# Patient Record
Sex: Male | Born: 1980 | Race: Asian | Hispanic: No | Marital: Married | Smoking: Never smoker
Health system: Southern US, Community
[De-identification: ages and names within clinical notes are randomized; demographics above are authoritative.]

## PROBLEM LIST (undated history)

## (undated) HISTORY — PX: NECK SURGERY: SHX720

---

## 2016-09-20 ENCOUNTER — Emergency Department (HOSPITAL_COMMUNITY): Payer: BLUE CROSS/BLUE SHIELD

## 2016-09-20 ENCOUNTER — Encounter (HOSPITAL_COMMUNITY): Payer: Self-pay

## 2016-09-20 DIAGNOSIS — Y999 Unspecified external cause status: Secondary | ICD-10-CM | POA: Diagnosis not present

## 2016-09-20 DIAGNOSIS — Y9366 Activity, soccer: Secondary | ICD-10-CM | POA: Diagnosis not present

## 2016-09-20 DIAGNOSIS — S53105A Unspecified dislocation of left ulnohumeral joint, initial encounter: Secondary | ICD-10-CM | POA: Insufficient documentation

## 2016-09-20 DIAGNOSIS — W1839XA Other fall on same level, initial encounter: Secondary | ICD-10-CM | POA: Diagnosis not present

## 2016-09-20 DIAGNOSIS — S59902A Unspecified injury of left elbow, initial encounter: Secondary | ICD-10-CM | POA: Diagnosis present

## 2016-09-20 DIAGNOSIS — Y9289 Other specified places as the place of occurrence of the external cause: Secondary | ICD-10-CM | POA: Diagnosis not present

## 2016-09-20 NOTE — ED Triage Notes (Signed)
Pt reports left elbow pain/injury secondary to playing soccer tonight. He was pushed, fell down and landed on his left elbow. Denies head injury, no LOC. + sensation, + radial pulse, able to wiggle fingers. Sling applied by nurse first.

## 2016-09-21 ENCOUNTER — Emergency Department (HOSPITAL_COMMUNITY): Payer: BLUE CROSS/BLUE SHIELD

## 2016-09-21 ENCOUNTER — Emergency Department (HOSPITAL_COMMUNITY)
Admission: EM | Admit: 2016-09-21 | Discharge: 2016-09-21 | Disposition: A | Discharge status: Home / Self Care | Payer: BLUE CROSS/BLUE SHIELD | Attending: Emergency Medicine | Admitting: Emergency Medicine

## 2016-09-21 DIAGNOSIS — S53105A Unspecified dislocation of left ulnohumeral joint, initial encounter: Secondary | ICD-10-CM

## 2016-09-21 MED ORDER — MIDAZOLAM HCL 2 MG/2ML IJ SOLN
1.0000 mg | Freq: Once | INTRAMUSCULAR | Status: AC
  Administered 2016-09-21: 1 mg via INTRAVENOUS
  Filled 2016-09-21: qty 2

## 2016-09-21 MED ORDER — HYDROMORPHONE HCL 1 MG/ML IJ SOLN
1.0000 mg | Freq: Once | INTRAMUSCULAR | Status: AC
  Administered 2016-09-21: 1 mg via INTRAVENOUS
  Filled 2016-09-21: qty 1

## 2016-09-21 MED ORDER — IBUPROFEN 800 MG PO TABS: 800.0000 mg | ORAL_TABLET | Freq: Three times a day (TID) | ORAL | 0 refills | Status: DC

## 2016-09-21 MED ORDER — HYDROCODONE-ACETAMINOPHEN 5-325 MG PO TABS: 1.0000 | ORAL_TABLET | ORAL | 0 refills | Status: DC | PRN

## 2016-09-21 NOTE — ED Provider Notes (Signed)
[facility]-EMERGENCY DEPT Provider Note   CSN: [id number] Arrival date & time: 09/20/16  2242 By signing my name below, I, Levon Hedger, attest that this documentation has been prepared under the direction and in the presence of Kyle Stevenson, Kyle Senior, MD . Electronically Signed: Levon Hedger, Scribe. 09/21/2016. 1:24 AM.   History   Chief Complaint Chief Complaint  Patient presents with  . Shoulder Pain   HPI Kyle Stevenson is a 36 y.o. male who presents to the Emergency Department complaining of sudden onset, constant pain to left lower arm extending from elbow to hand s/p mechanical fall tonight PTA. Pt was playing soccer when he was pushed, causing him to fall, extending his left arm. Pain is exacerbated by movement, no alleviating factors noted. No OTC treatments tried for these symptoms PTA. No head injury or LOC. He denies any other pain and has no other acute complaints at this time.  The history is provided by the patient. No language interpreter was used.   History reviewed. No pertinent past medical history.  There are no active problems to display for this patient.  Past Surgical History:  Procedure Laterality Date  . NECK SURGERY     lymph node removed    Home Medications    Prior to Admission medications   Not on File   Family History No family history on file.  Social History Social History  Substance Use Topics  . Smoking status: Never Smoker  . Smokeless tobacco: Never Used  . Alcohol use Yes   Allergies   Patient has no allergy information on record.  Review of Systems Review of Systems All systems reviewed and are negative for acute change except as noted in the HPI.  Physical Exam Updated Vital Signs BP 120/85   Pulse 70   Temp 98.8 F (37.1 C) (Oral)   Resp 18   SpO2 100%   Physical Exam  Constitutional: He is oriented to person, place, and time. He appears well-developed and well-nourished. No distress.  HENT:  Head: Normocephalic and atraumatic.    Mouth/Throat: Oropharynx is clear and moist. No oropharyngeal exudate.  Eyes: Conjunctivae and EOM are normal. Pupils are equal, round, and reactive to light.  Neck: Normal range of motion. Neck supple.  No meningismus.  Cardiovascular: Normal rate, regular rhythm, normal heart sounds and intact distal pulses.   No murmur heard. Pulmonary/Chest: Effort normal and breath sounds normal. No respiratory distress.  Abdominal: Soft. There is no tenderness. There is no rebound and no guarding.  Musculoskeletal: Normal range of motion. He exhibits edema and deformity.  There is deformity to the left elbow with apparent dislocation of the left olecranon. Radial pulses intact. No open wounds.  Intact cardinal hand movements.  Neurological: He is alert and oriented to person, place, and time. No cranial nerve deficit. He exhibits normal muscle tone. Coordination normal.   5/5 strength throughout. CN 2-12 intact.Equal grip strength.   Skin: Skin is warm.  Psychiatric: He has a normal mood and affect. His behavior is normal.  Nursing note and vitals reviewed.  ED Treatments / Results  DIAGNOSTIC STUDIES:  Oxygen Saturation is 100% on RA, normal by my interpretation.    COORDINATION OF CARE:  1:23 AM Will order dilaudid and versed. Discussed treatment plan with pt at bedside and pt agreed to plan.   Labs (all labs ordered are listed, but only abnormal results are displayed) Labs Reviewed - No data to display  EKG  EKG Interpretation None  Radiology Dg Elbow Complete Left  Result Date: 09/21/2016 CLINICAL DATA:  Status post reduction of left elbow dislocation. Initial encounter. EXAM: LEFT ELBOW - COMPLETE 3+ VIEW COMPARISON:  Left elbow radiographs performed 09/20/2016 FINDINGS: There has been successful reduction of the left elbow dislocation. No definite fracture is seen. No elbow joint effusion is seen at this time. Mild soft tissue swelling is noted about the medial aspect of the  elbow. IMPRESSION: Successful reduction of left elbow dislocation. No definite fracture seen. No elbow joint effusion seen. Electronically Signed   By: Roanna RaiderJeffery  Chang M.D.   On: 09/21/2016 02:27   Dg Elbow Complete Left  Result Date: 09/20/2016 CLINICAL DATA:  Soccer injury to the left elbow after a fall. Left elbow pain. EXAM: LEFT ELBOW - COMPLETE 3+ VIEW COMPARISON:  None. FINDINGS: Posterior dislocation of the radius and ulna with respect to the distal humerus. No acute fractures are demonstrated. No focal bone lesion or bone destruction. Soft tissue swelling. IMPRESSION: Posterior dislocation of the left elbow. Electronically Signed   By: Burman NievesWilliam  Stevens M.D.   On: 09/20/2016 23:47    Procedures Reduction of dislocation Date/Time: 09/21/2016 2:37 AM Performed by: Kyle Stevenson, Kyle Stevenson Authorized by: Kyle Stevenson, Kyle Stevenson  Consent: Verbal consent obtained. Risks and benefits: risks, benefits and alternatives were discussed Consent given by: patient Patient understanding: patient states understanding of the procedure being performed Patient identity confirmed: verbally with patient and provided demographic data  Sedation: Patient sedated: yes Sedation type: anxiolysis Sedatives: midazolam Analgesia: hydromorphone Vitals: Vital signs were monitored during sedation. Patient tolerance: Patient tolerated the procedure well with no immediate complications    (including critical care time)  Medications Ordered in ED Medications - No data to display  Initial Impression / Assessment and Plan / ED Course  I have reviewed the triage vital signs and the nursing notes.  Pertinent labs & imaging results that were available during my care of the patient were reviewed by me and considered in my medical decision making (see chart for details).     Patient presents with left elbow pain and deformity after falling on outstretched arm while playing soccer. Denies pain in shoulder or wrist. Denies head  injury. Neurovascularly intact.  X-ray shows posterior elbow dislocation.  Patient does not want to try conscious sedation but will be given pain medication and antianxiety medication.  Elbow successfully reduced as above.  Patient placed in posterior splint and sling as he was still having some pain after reduction. No fracture seen on xray.  NVI.  Folllowup with ortho. Return precautions discussed.  Final Clinical Impressions(s) / ED Diagnoses   Final diagnoses:  Closed dislocation of left elbow, initial encounter    New Prescriptions New Prescriptions   No medications on file   I personally performed the services described in this documentation, which was scribed in my presence. The recorded information has been reviewed and is accurate.    Kyle Stevenson, Eliany Mccarter, MD 09/21/16 [phone]

## 2016-09-21 NOTE — Discharge Instructions (Signed)
Wear the splint as prescribed. Followup with Dr. Lajoyce Cornersuda. Return to the ED if you develop new or worsening symptoms.

## 2016-09-21 NOTE — ED Notes (Signed)
Pt stable, ambulatory, states understanding of discharge instructions 

## 2016-09-21 NOTE — ED Notes (Signed)
Ortho called 

## 2016-09-23 ENCOUNTER — Ambulatory Visit (INDEPENDENT_AMBULATORY_CARE_PROVIDER_SITE_OTHER): Payer: BLUE CROSS/BLUE SHIELD | Admitting: Orthopedic Surgery

## 2016-09-23 DIAGNOSIS — S53105D Unspecified dislocation of left ulnohumeral joint, subsequent encounter: Secondary | ICD-10-CM | POA: Insufficient documentation

## 2016-09-23 MED ORDER — HYDROCODONE-ACETAMINOPHEN 5-325 MG PO TABS: 1.0000 | ORAL_TABLET | Freq: Four times a day (QID) | ORAL | 0 refills | Status: DC | PRN

## 2016-09-23 NOTE — Progress Notes (Signed)
   Office Visit Note   Patient: Kyle ParmaByung Stevenson           Date of Birth: 01/09/1981           MRN: [id number] Visit Date: 09/23/2016              Requested by: No referring provider defined for this encounter. PCP: Patient, No Pcp Per  Chief Complaint  Patient presents with  . Left Elbow - Follow-up    [facility]      HPI: Patient is a [age] gentleman who was playing soccer and landed on an outstretched left arm dislocated his left elbow he underwent closed reduction in the emergency room and presents at this time for initial evaluation. Patient denies any radicular symptoms.  Assessment & Plan: Visit Diagnoses:  1. Dislocated elbow, left, subsequent encounter     Plan: Recommended flexion and extension range of motion exercises discontinue the sling supination and pronation. Stay away from activities for at least 4 weeks. No lifting.  Follow-Up Instructions: Return if symptoms worsen or fail to improve.   Ortho Exam  Patient is alert, oriented, no adenopathy, well-dressed, normal affect, normal respiratory effort. Examination patient has ecchymosis bruising and swelling medially and laterally to the elbow. He has full supination and pronation with no crepitation. He has decreased range of motion with flexion and extension. Patient does have hypermobility of his joints and has hyperextension of the right elbow. Radiographs reviewed which shows no evidence of any bony fragments in the elbow the elbow was well aligned.  Imaging: No results found.  Labs: No results found for: HGBA1C, ESRSEDRATE, CRP, LABURIC, REPTSTATUS, GRAMSTAIN, CULT, LABORGA  Orders:  No orders of the defined types were placed in this encounter.  Meds ordered this encounter  Medications  . HYDROcodone-acetaminophen (NORCO/VICODIN) 5-325 MG tablet    Sig: Take 1 tablet by mouth every 6 (six) hours as needed for moderate pain.    Dispense:  30 tablet    Refill:  0      Procedures: No procedures performed  Clinical Data: No additional findings.  ROS:  All other systems negative, except as noted in the HPI. Review of Systems  Objective: Vital Signs: There were no vitals taken for this visit.  Specialty Comments:  No specialty comments available.  PMFS History: Patient Active Problem List   Diagnosis Date Noted  . Dislocated elbow, left, subsequent encounter 09/23/2016   No past medical history on file.  No family history on file.  Past Surgical History:  Procedure Laterality Date  . NECK SURGERY     lymph node removed   Social History   Occupational History  . Not on file.   Social History Main Topics  . Smoking status: Never Smoker  . Smokeless tobacco: Never Used  . Alcohol use Yes  . Drug use: Unknown  . Sexual activity: Not on file

## 2016-10-07 ENCOUNTER — Telehealth (INDEPENDENT_AMBULATORY_CARE_PROVIDER_SITE_OTHER): Payer: Self-pay | Admitting: Orthopedic Surgery

## 2016-10-07 ENCOUNTER — Other Ambulatory Visit (INDEPENDENT_AMBULATORY_CARE_PROVIDER_SITE_OTHER): Payer: Self-pay | Admitting: Family

## 2016-10-07 MED ORDER — HYDROCODONE-ACETAMINOPHEN 5-325 MG PO TABS: 1.0000 | ORAL_TABLET | Freq: Three times a day (TID) | ORAL | 0 refills | Status: AC | PRN

## 2016-10-07 MED ORDER — IBUPROFEN 800 MG PO TABS: 800.0000 mg | ORAL_TABLET | Freq: Three times a day (TID) | ORAL | 0 refills | Status: AC

## 2016-10-07 NOTE — Telephone Encounter (Signed)
Patient called needing Rx refilled Vicodin and Ibuprofen.  [facility] The number to contact patient is [phone]

## 2016-10-08 NOTE — Telephone Encounter (Signed)
Pt in office for rx

## 2016-10-08 NOTE — Telephone Encounter (Signed)
ERROR

## 2016-10-08 NOTE — Telephone Encounter (Signed)
Called pt non answer message states VM not set up yet. Will try again.

## 2018-04-22 IMAGING — DX DG ELBOW COMPLETE 3+V*L*
4 series · 4 of 4 positions shown · non-contrast
Comparison: Left elbow radiographs performed 09/20/2016

CLINICAL DATA: Status post reduction of left elbow dislocation.
Initial encounter.

EXAM:
LEFT ELBOW - COMPLETE 3+ VIEW

[elbow ap]
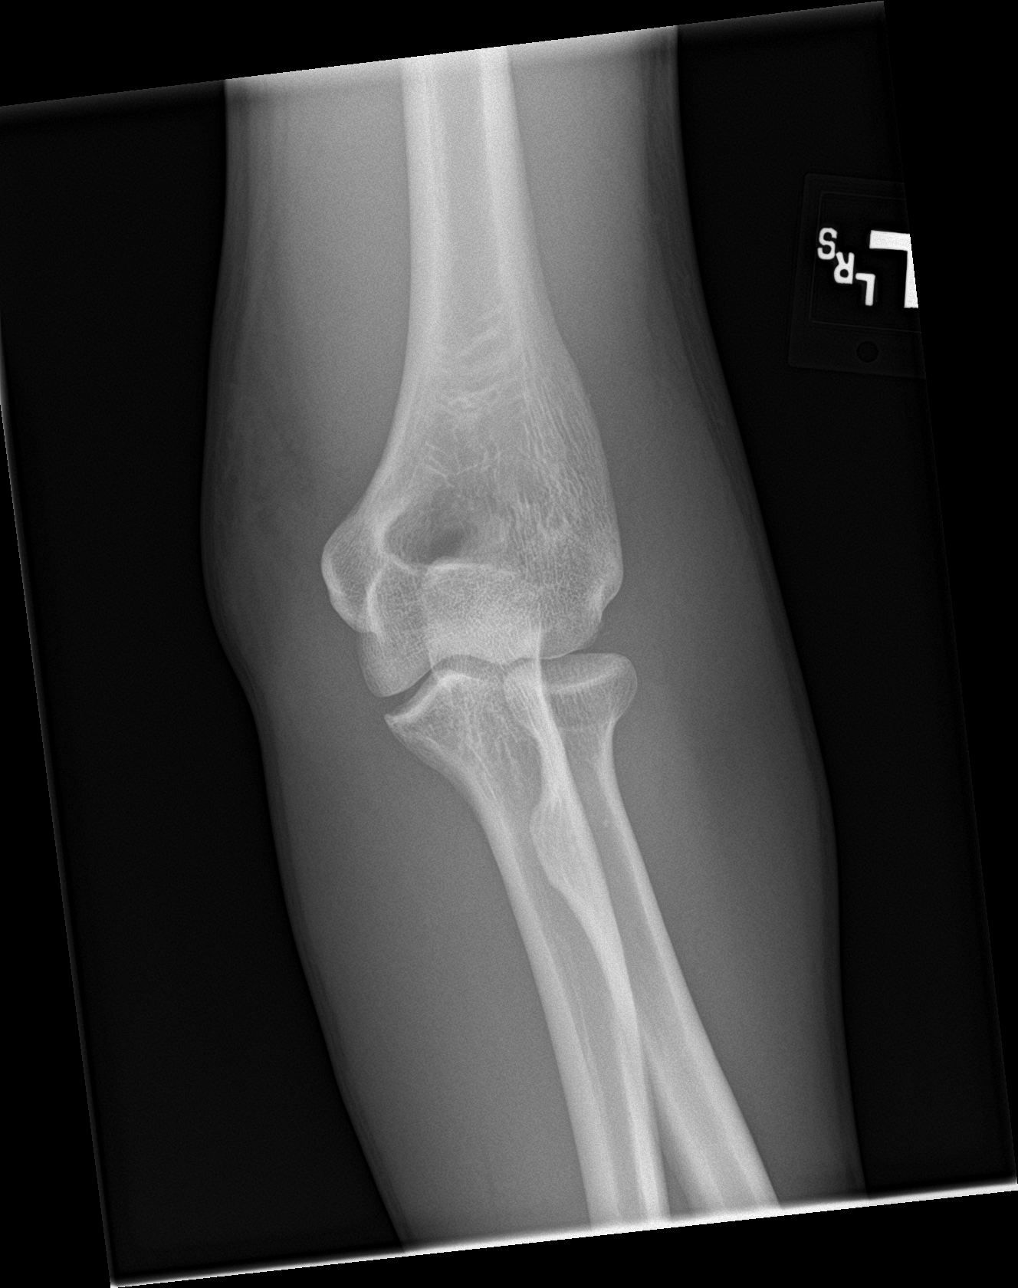

[elbow obl (1 of 2)]
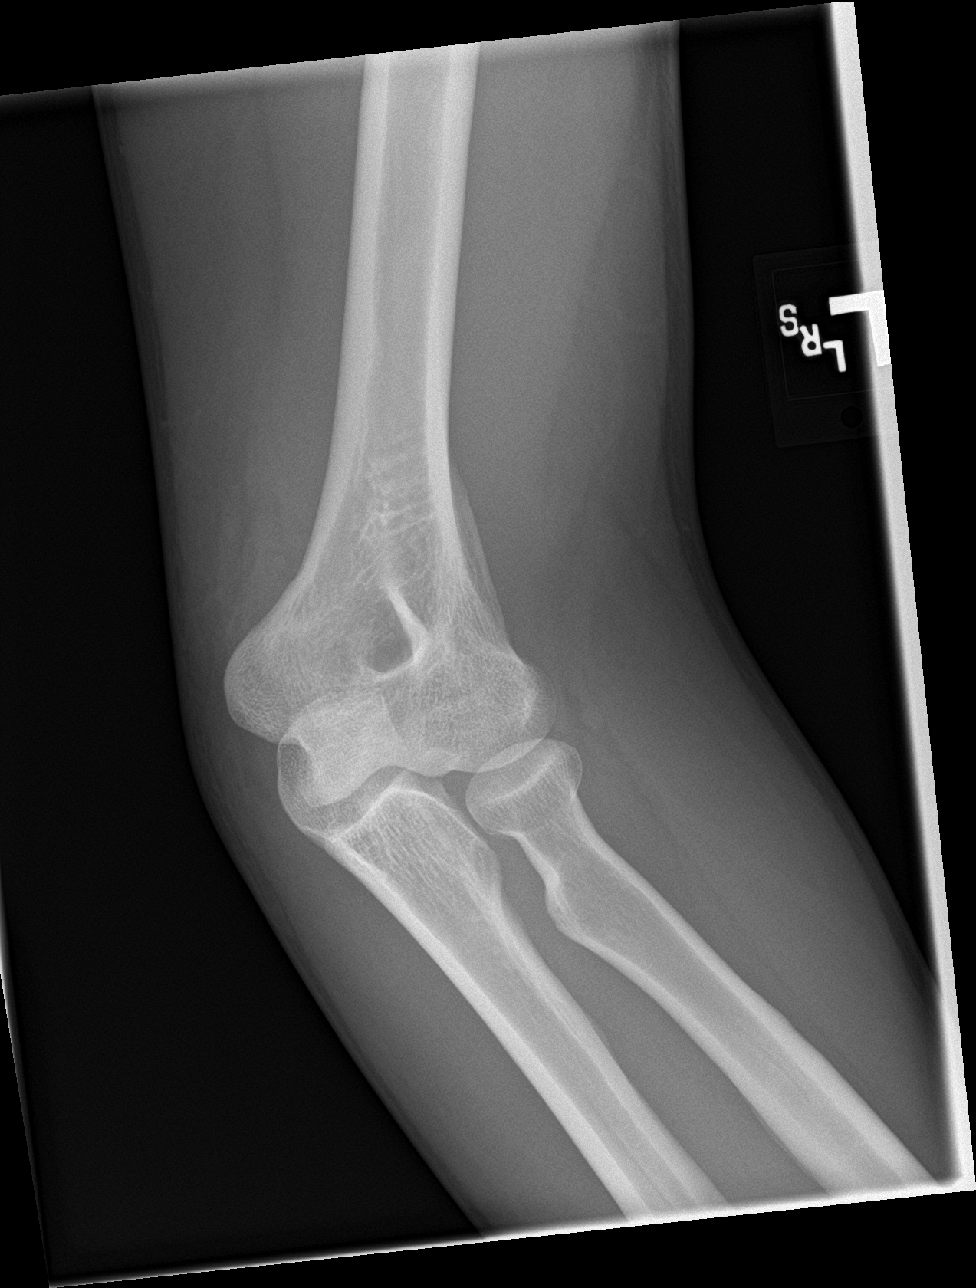

[elbow obl (2 of 2)]
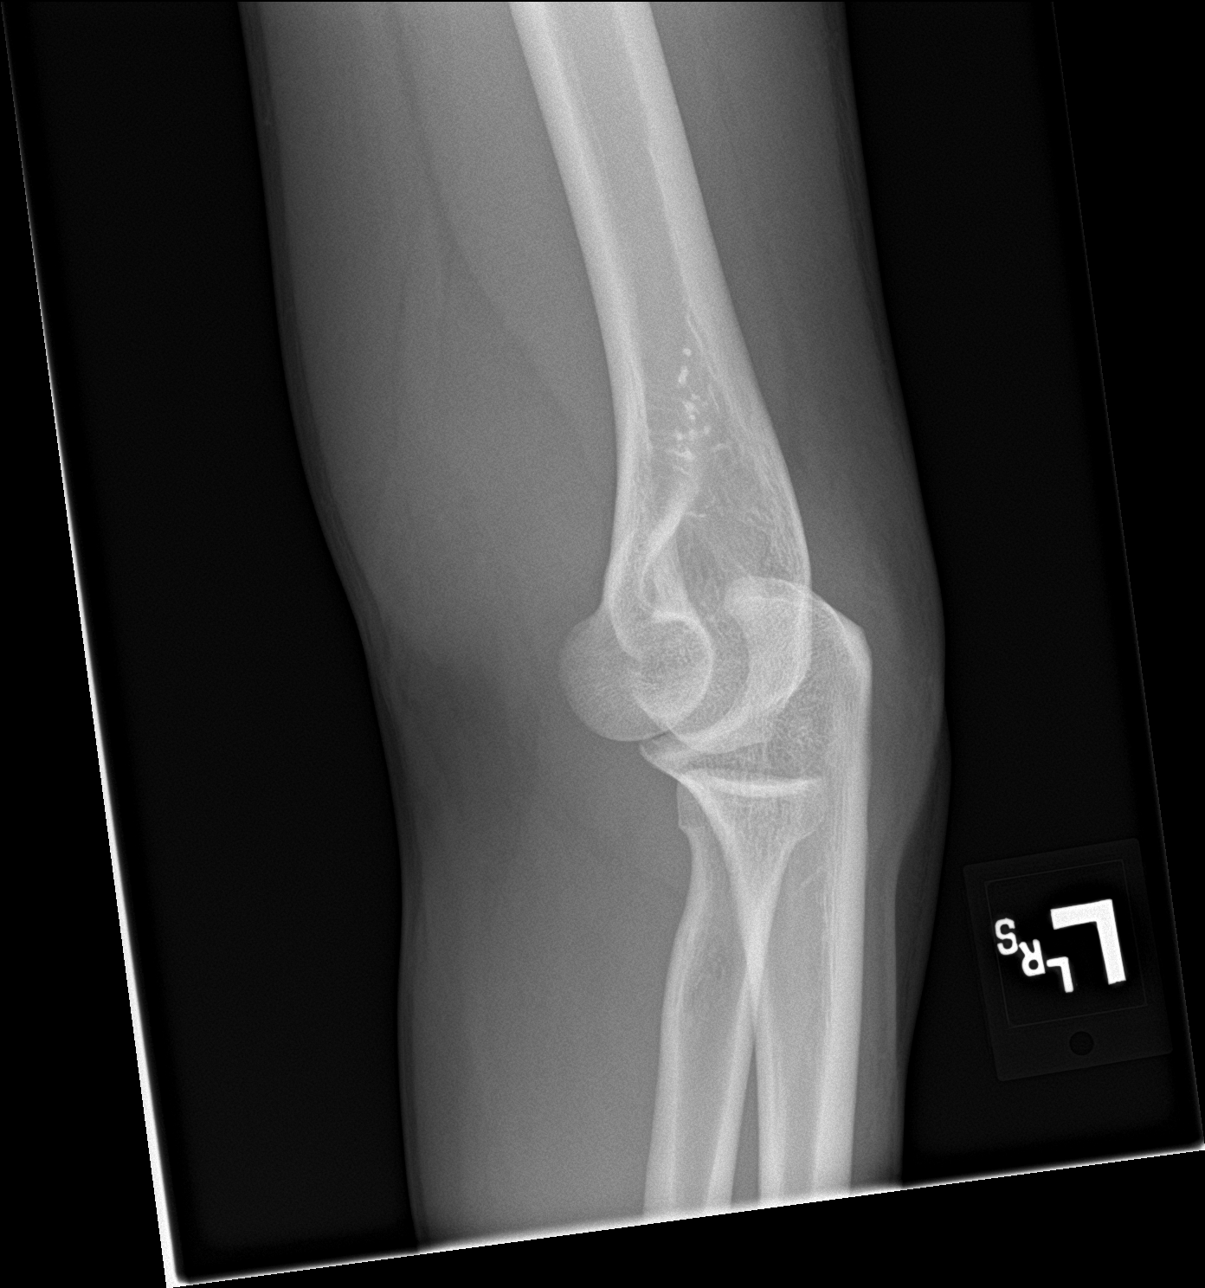

[elbow lat]
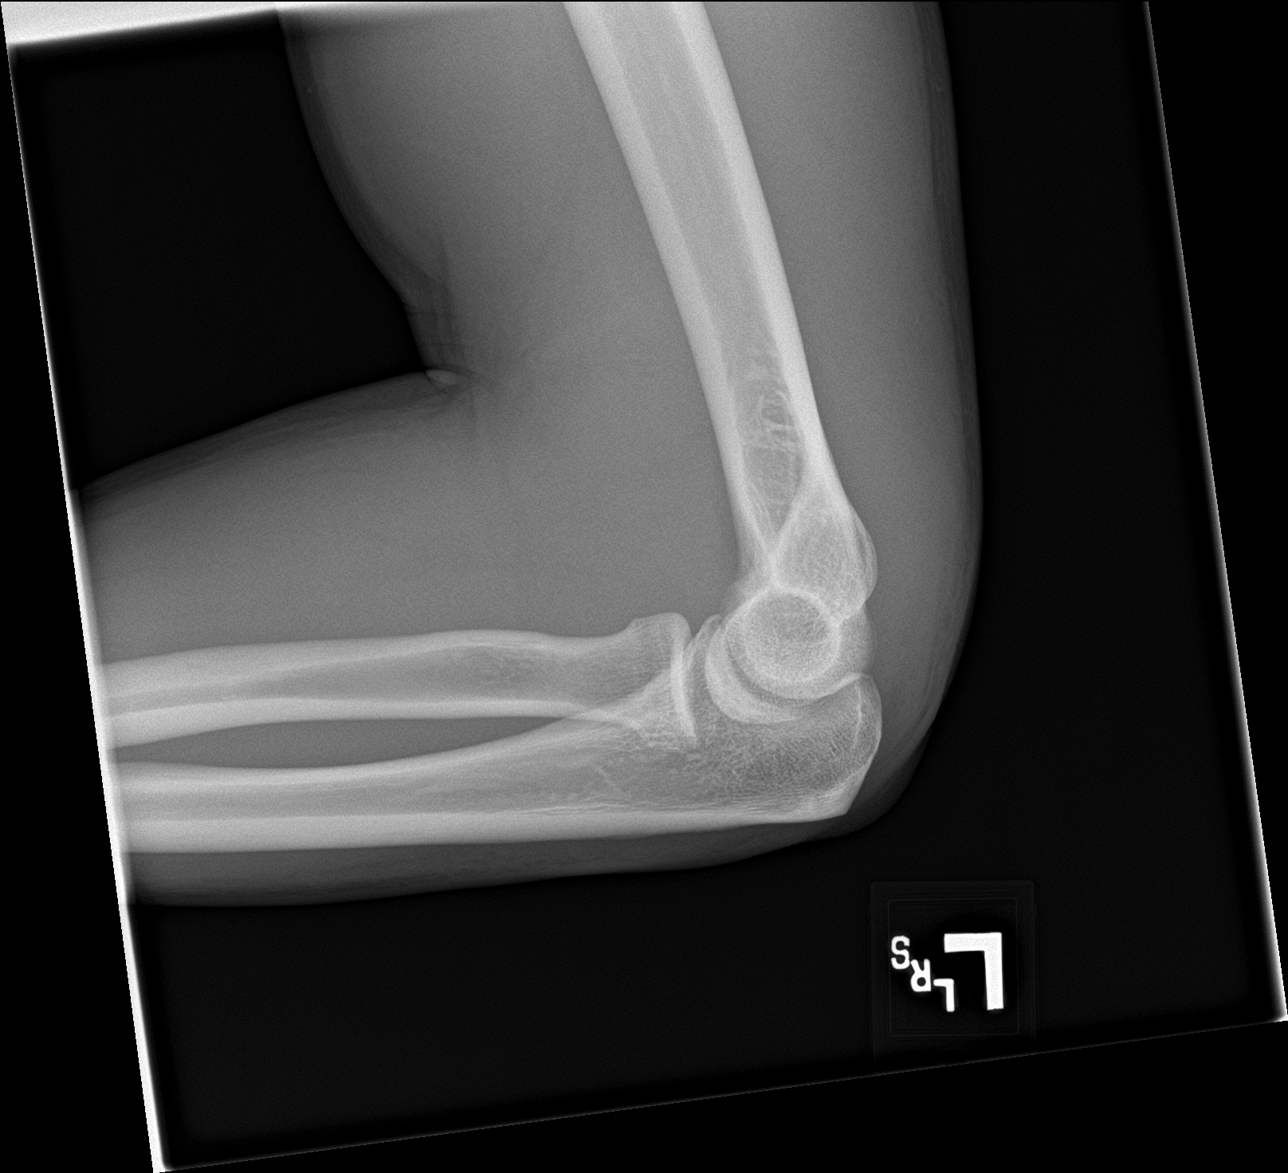

[4 of 4 positions shown; findings below may reference images not displayed]

FINDINGS: There has been successful reduction of the left elbow dislocation.
No definite fracture is seen. No elbow joint effusion is seen at
this time.

Mild soft tissue swelling is noted about the medial aspect of the
elbow.
IMPRESSION: Successful reduction of left elbow dislocation. No definite fracture
seen. No elbow joint effusion seen.
